# Patient Record
Sex: Female | Born: 1955 | Race: White | Hispanic: No | Marital: Married | Smoking: Never smoker
Health system: Southern US, Community
[De-identification: ages and names within clinical notes are randomized; demographics above are authoritative.]

## PROBLEM LIST (undated history)

## (undated) DIAGNOSIS — E119 Type 2 diabetes mellitus without complications: Secondary | ICD-10-CM

## (undated) DIAGNOSIS — M069 Rheumatoid arthritis, unspecified: Secondary | ICD-10-CM

---

## 2020-02-15 ENCOUNTER — Emergency Department (INDEPENDENT_AMBULATORY_CARE_PROVIDER_SITE_OTHER): Payer: Federal, State, Local not specified - PPO

## 2020-02-15 ENCOUNTER — Other Ambulatory Visit: Payer: Self-pay

## 2020-02-15 ENCOUNTER — Emergency Department (INDEPENDENT_AMBULATORY_CARE_PROVIDER_SITE_OTHER)
Admission: EM | Admit: 2020-02-15 | Discharge: 2020-02-15 | Disposition: A | Discharge status: Home / Self Care | Payer: Federal, State, Local not specified - PPO | Source: Home / Self Care

## 2020-02-15 DIAGNOSIS — M47816 Spondylosis without myelopathy or radiculopathy, lumbar region: Secondary | ICD-10-CM

## 2020-02-15 DIAGNOSIS — M5136 Other intervertebral disc degeneration, lumbar region: Secondary | ICD-10-CM | POA: Diagnosis not present

## 2020-02-15 DIAGNOSIS — G8929 Other chronic pain: Secondary | ICD-10-CM

## 2020-02-15 DIAGNOSIS — M25552 Pain in left hip: Secondary | ICD-10-CM

## 2020-02-15 DIAGNOSIS — M79662 Pain in left lower leg: Secondary | ICD-10-CM

## 2020-02-15 DIAGNOSIS — M545 Low back pain, unspecified: Secondary | ICD-10-CM | POA: Diagnosis not present

## 2020-02-15 DIAGNOSIS — W108XXA Fall (on) (from) other stairs and steps, initial encounter: Secondary | ICD-10-CM

## 2020-02-15 HISTORY — DX: Rheumatoid arthritis, unspecified: M06.9

## 2020-02-15 HISTORY — DX: Type 2 diabetes mellitus without complications: E11.9

## 2020-02-15 MED ORDER — METHOCARBAMOL 500 MG PO TABS: 500.0000 mg | ORAL_TABLET | Freq: Two times a day (BID) | ORAL | 0 refills | Status: AC

## 2020-02-15 NOTE — ED Triage Notes (Signed)
Pt c/o lower back pain since Tues when she fell backward onto some steps in her driveway. Also having pain in lower left leg/calf area from fall. Pain 7/10 Tylenol, heat and ice tried with little relief. Hx of lower back issues, has had some procedures done to "deaden" the nerves in lower back.

## 2020-02-15 NOTE — Discharge Instructions (Signed)
°  Your x-rays did not show any new findings. It is recommended you call your pain specialist tomorrow for recheck of symptoms.   Call 911 or have someone drive you to the hospital if symptoms significantly worsening including worsening pain, trouble walking, trouble controlling your stool or urine, or other new concerning symptoms develop.

## 2020-02-15 NOTE — ED Provider Notes (Signed)
[facility]    CSN: [id number] Arrival date & time: 02/15/20  1447      History   Chief Complaint Chief Complaint  Patient presents with  . Back Pain    HPI Teresa Rivera is a 64 y.o. female.   HPI  Teresa Rivera is a 64 y.o. female presenting to UC with c/o Left lower back, hip and leg pain after falling backward on cement steps in driveway 2 days ago. Denies hitting head or LOC. Pain is aching and sore, worse with certain movements.  She has taken Tylenol and used ice and heat with mild relief.  Hx of chronic low back pain.  Pt had bilateral facet/paravert joint radiofrequency at L4-L5, L5-S1 on 02/02/20. Pt states the procedure was minimally invasive, she was awake and notes the incisions were "pinpoint" in size. Pt reports having improvement of her pain but now pain is worse than before the surgery. Pt has not called her pain management specialist yet.  Denies numbness or weakness in legs. No change in bowel or bladder habits.   Past Medical History:  Diagnosis Date  . Diabetes mellitus without complication (HCC)   . Rheumatoid arthritis (HCC)     There are no problems to display for this patient.   History reviewed. No pertinent surgical history.  OB History   No obstetric history on file.      Home Medications    Prior to Admission medications   Medication Sig Start Date End Date Taking? Authorizing Provider  albuterol (VENTOLIN HFA) 108 (90 Base) MCG/ACT inhaler Inhale into the lungs. 11/16/19  Yes [provider]  atomoxetine (STRATTERA) 25 MG capsule Take by mouth. 12/05/19  Yes [provider]  atorvastatin (LIPITOR) 40 MG tablet Take by mouth. 11/30/17 03/12/20 Yes [provider]  azelastine (ASTELIN) 0.1 % nasal spray instill 2 sprays into each nostril twice a day as directed 09/06/13  Yes [provider]  buPROPion (WELLBUTRIN XL) 150 MG 24 hr tablet Take by mouth. 01/10/19  Yes [provider]   Cyanocobalamin 1000 MCG SUBL Place under the tongue. 02/18/17  Yes [provider]  escitalopram (LEXAPRO) 10 MG tablet Take by mouth. 06/15/16  Yes [provider]  fluticasone (FLOVENT HFA) 110 MCG/ACT inhaler Inhale into the lungs. 08/30/18 11/09/20 Yes [provider]  gabapentin (NEURONTIN) 300 MG capsule Take 1 capsule (300mg ) in the morning and 2 capsules (600mg ) at bedtime 03/08/19  Yes [provider]  glimepiride (AMARYL) 4 MG tablet Take by mouth. 03/02/19 03/12/20 Yes [provider]  hydroxychloroquine (PLAQUENIL) 200 MG tablet Take 1 tablet by mouth 2 (two) times daily. 09/12/13 01/13/21 Yes [provider]  levocetirizine (XYZAL) 5 MG tablet Take by mouth. 02/24/19 02/11/21 Yes [provider]  LORazepam (ATIVAN) 1 MG tablet 1 mg every 8 (eight) hours as needed. 11/17/11  Yes [provider]  losartan (COZAAR) 50 MG tablet Take by mouth. 03/08/19  Yes [provider]  temazepam (RESTORIL) 15 MG capsule Take 1-2 caps nightly PRN insomnia 02/14/20  Yes [provider]  methocarbamol (ROBAXIN) 500 MG tablet Take 1 tablet (500 mg total) by mouth 2 (two) times daily. 02/15/20   13/3/21, PA-C    Family History History reviewed. No pertinent family history.  Social History Social History   Tobacco Use  . Smoking status: Never Smoker  . Smokeless tobacco: Never Used  Vaping Use  . Vaping Use: Never used  Substance Use Topics  .  Alcohol use: Not Currently  . Drug use: Not on file     Allergies   Patient has no known allergies.   Review of Systems Review of Systems  Musculoskeletal: Positive for back pain and myalgias.  Neurological: Negative for dizziness, weakness, numbness and headaches.     Physical Exam Triage Vital Signs ED Triage Vitals  Enc Vitals Group     BP 02/15/20 1510 93/66     Pulse Rate 02/15/20 1510 80     Resp 02/15/20 1510 18     Temp 02/15/20 1510 98.6 F (37  C)     Temp Source 02/15/20 1510 Oral     SpO2 02/15/20 1510 97 %     Weight --      Height --      Head Circumference --      Peak Flow --      Pain Score 02/15/20 1501 7     Pain Loc --      Pain Edu? --      Excl. in GC? --    No data found.  Updated Vital Signs BP 93/66 (BP Location: Right Arm)   Pulse 80   Temp 98.6 F (37 C) (Oral)   Resp 18   SpO2 97%   Visual Acuity Right Eye Distance:   Left Eye Distance:   Bilateral Distance:    Right Eye Near:   Left Eye Near:    Bilateral Near:     Physical Exam Vitals and nursing note reviewed.  Constitutional:      General: She is not in acute distress.    Appearance: Normal appearance. She is well-developed. She is obese. She is not ill-appearing, toxic-appearing or diaphoretic.  HENT:     Head: Normocephalic and atraumatic.  Cardiovascular:     Rate and Rhythm: Normal rate.  Pulmonary:     Effort: Pulmonary effort is normal.  Musculoskeletal:        General: Tenderness present. Normal range of motion.     Cervical back: Normal range of motion.       Back:     Comments: Tenderness to Left lower back and hip. No deformity. Full ROM hip with increased pain with movement.  Mild tenderness to Left lateral lower leg. Calf is soft, non-tender.  Full ROM Left knee, non-tender.  Skin:    General: Skin is warm and dry.     Findings: No bruising or erythema.  Neurological:     Mental Status: She is alert and oriented to person, place, and time.  Psychiatric:        Behavior: Behavior normal.      UC Treatments / Results  Labs (all labs ordered are listed, but only abnormal results are displayed) Labs Reviewed - No data to display  EKG   Radiology DG Lumbar Spine Complete  Result Date: 02/15/2020 CLINICAL DATA:  Larey Seat.  Back pain. EXAM: LUMBAR SPINE - COMPLETE 4+ VIEW COMPARISON:  None. FINDINGS: Moderate degenerative lumbar spondylosis with multilevel disc disease and facet disease. The lumbar vertebral  bodies are normally aligned. No acute fracture is identified. The facets are normally aligned. No pars defects. The visualized bony pelvis is intact.  The SI joints appear normal. IMPRESSION: Degenerative lumbar spondylosis with multilevel disc disease and facet disease but no acute bony findings. Electronically Signed   By: Rudie Meyer M.D.   On: 02/15/2020 16:16   DG Hip Unilat W or Wo Pelvis 2-3 Views Left  Result Date: 02/15/2020 CLINICAL  DATA:  Fall.  Hip pain. EXAM: DG HIP (WITH OR WITHOUT PELVIS) 2-3V LEFT COMPARISON:  None. FINDINGS: There is no evidence of hip fracture or dislocation. Mild bilateral hip degenerative change. Partially imaged lower lumbar spine degenerative change. Scattered enthesopathy. IMPRESSION: No evidence of acute fracture or dislocation. Electronically Signed   By: Feliberto Harts MD   On: 02/15/2020 16:17    Procedures Procedures (including critical care time)  Medications Ordered in UC Medications - No data to display  Initial Impression / Assessment and Plan / UC Course  I have reviewed the triage vital signs and the nursing notes.  Pertinent labs & imaging results that were available during my care of the patient were reviewed by me and considered in my medical decision making (see chart for details).     Reviewed imaging with pt No red flag symptoms Encouraged to call her surgeon tomorrow to discuss the fall in case additional imaging is recommended Discussed symptoms that warrant emergent care in the ED. AVS given  Final Clinical Impressions(s) / UC Diagnoses   Final diagnoses:  Fall down stairs, initial encounter  Acute exacerbation of chronic low back pain  Left hip pain  Pain in left lower leg     Discharge Instructions      Your x-rays did not show any new findings. It is recommended you call your pain specialist tomorrow for recheck of symptoms.   Call 911 or have someone drive you to the hospital if symptoms significantly  worsening including worsening pain, trouble walking, trouble controlling your stool or urine, or other new concerning symptoms develop.      ED Prescriptions    Medication Sig Dispense Auth. Provider   methocarbamol (ROBAXIN) 500 MG tablet Take 1 tablet (500 mg total) by mouth 2 (two) times daily. 10 tablet Lurene Shadow, PA-C     PDMP not reviewed this encounter.   Lurene Shadow, PA-C 02/15/20 2005

## 2021-10-08 IMAGING — DX DG HIP (WITH OR WITHOUT PELVIS) 2-3V*L*
3 series · 3 of 3 positions shown · non-contrast
Comparison: None.

CLINICAL DATA: Fall.  Hip pain.

EXAM:
DG HIP (WITH OR WITHOUT PELVIS) 2-3V LEFT

[pelvis ap]
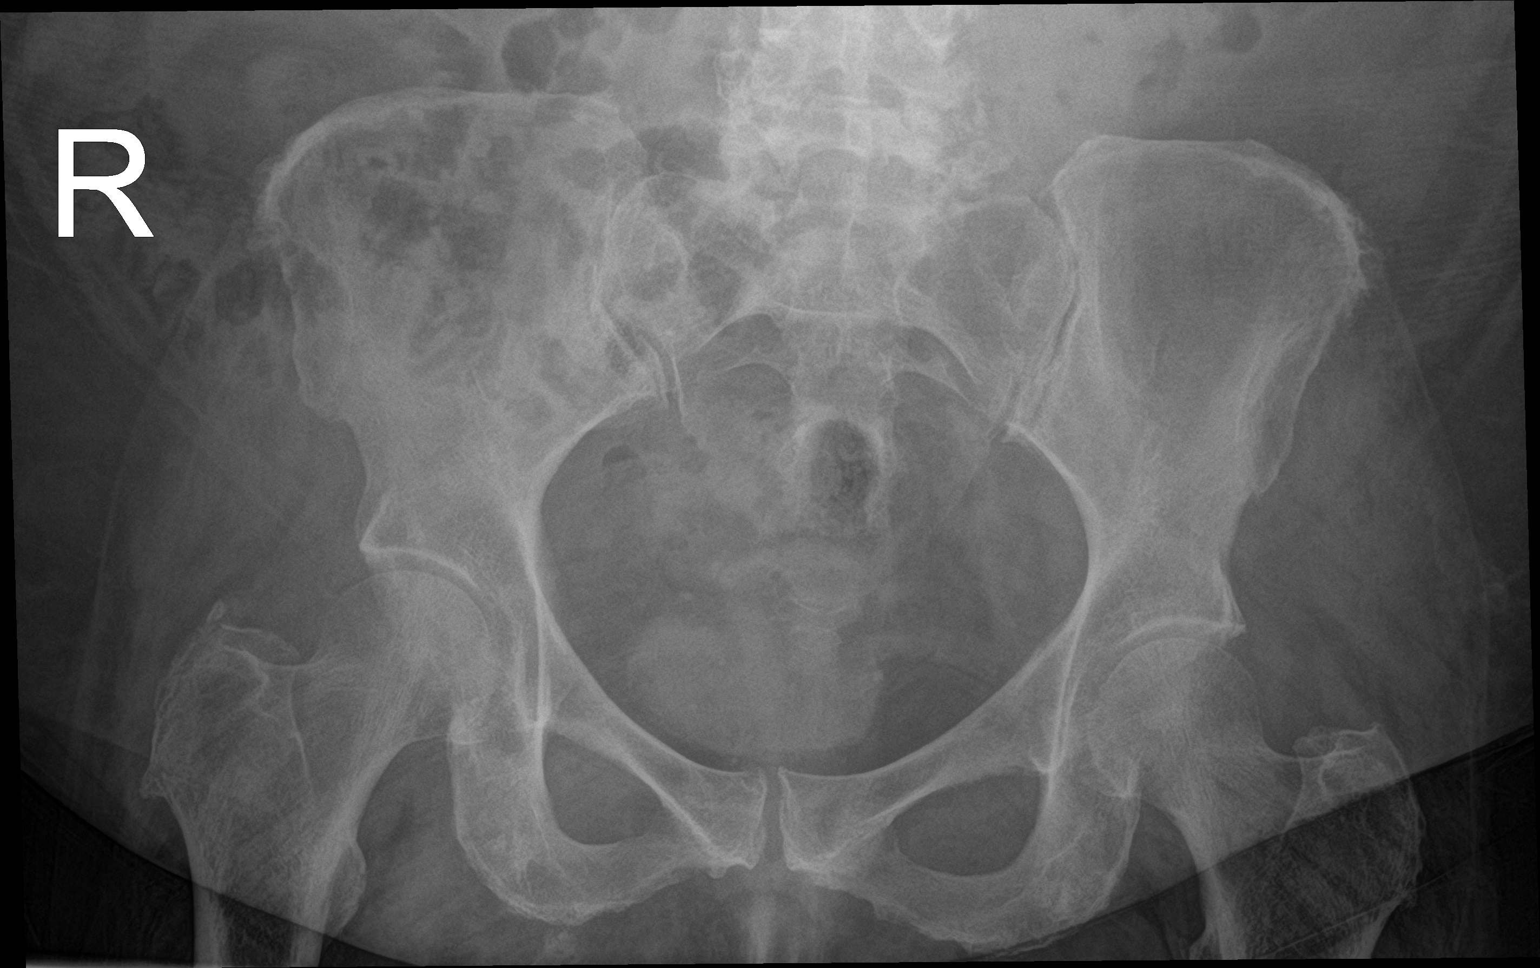

[hip ap]
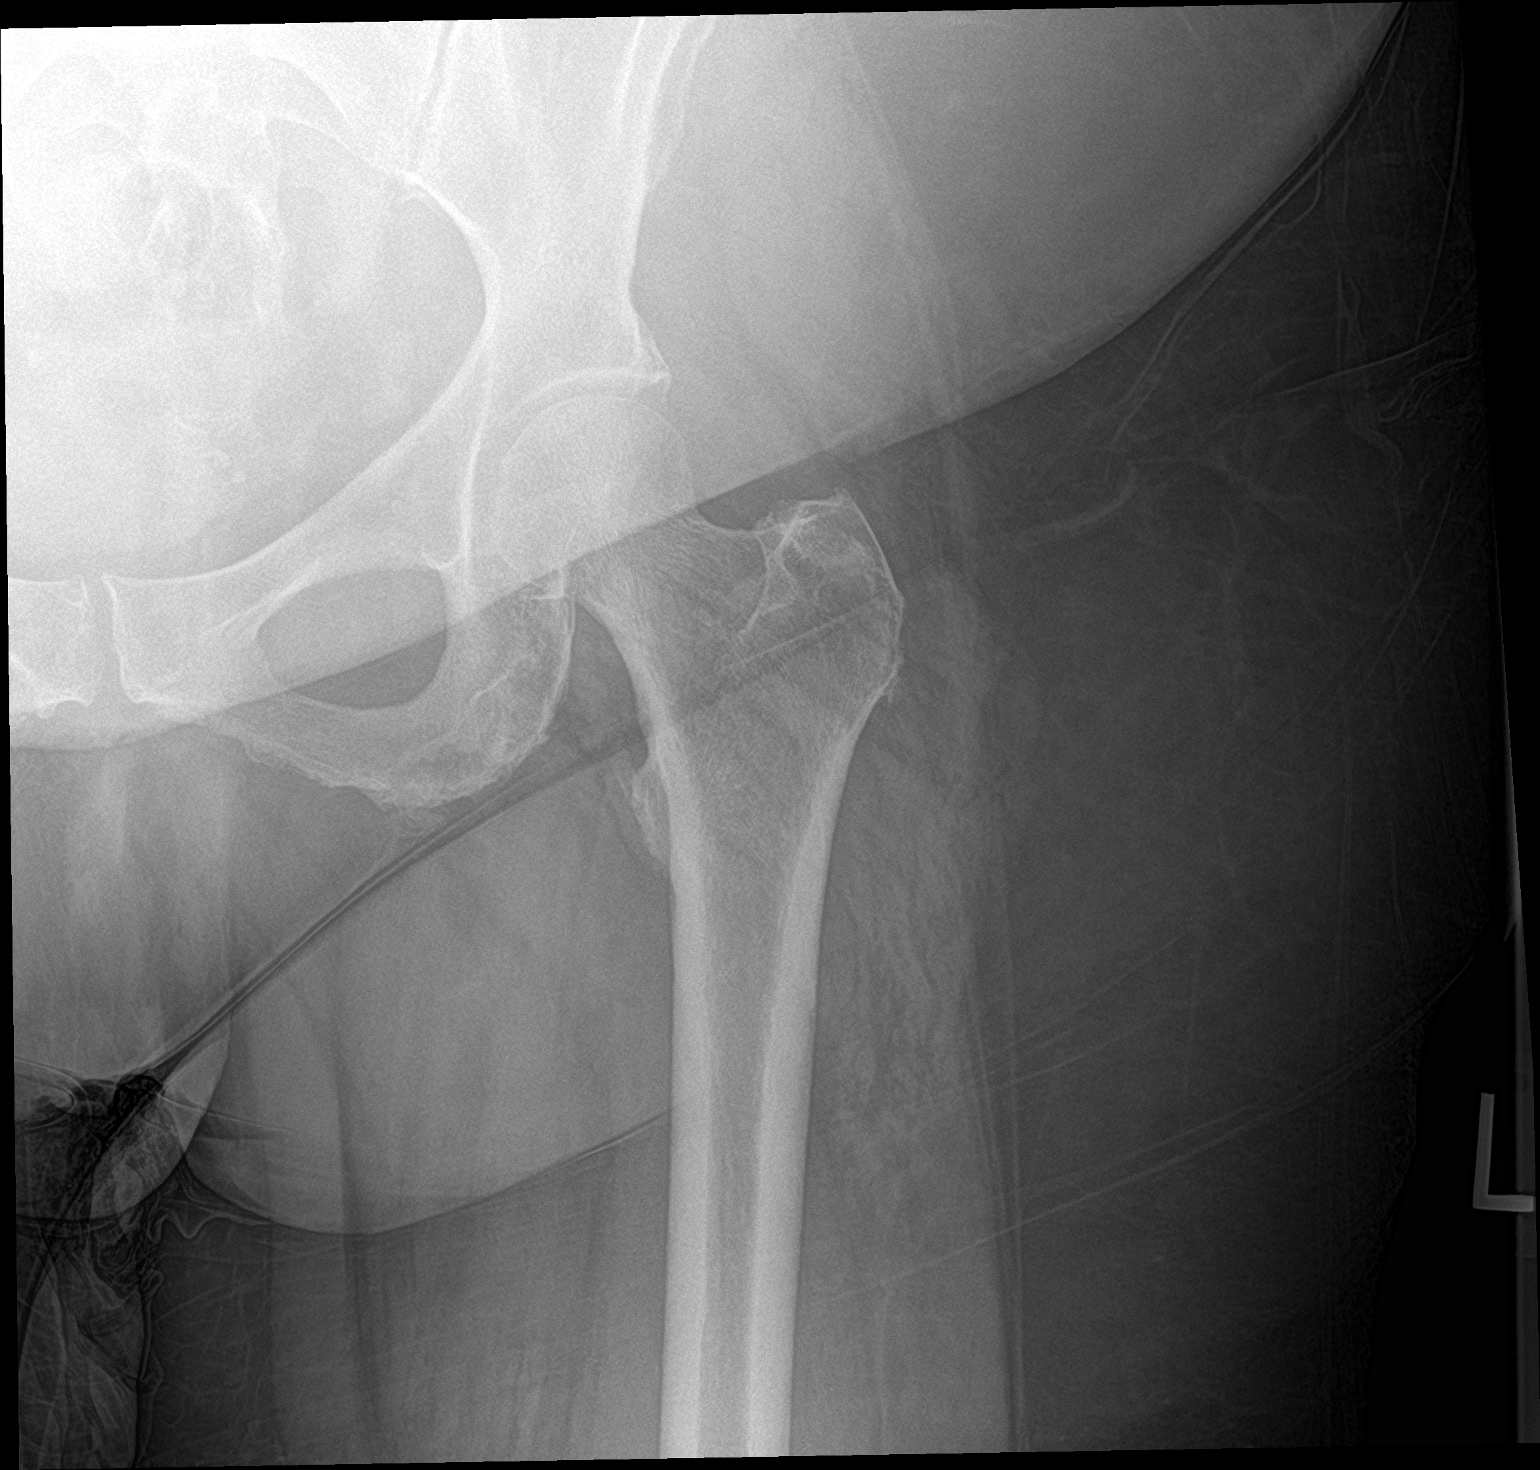

[hip lat]
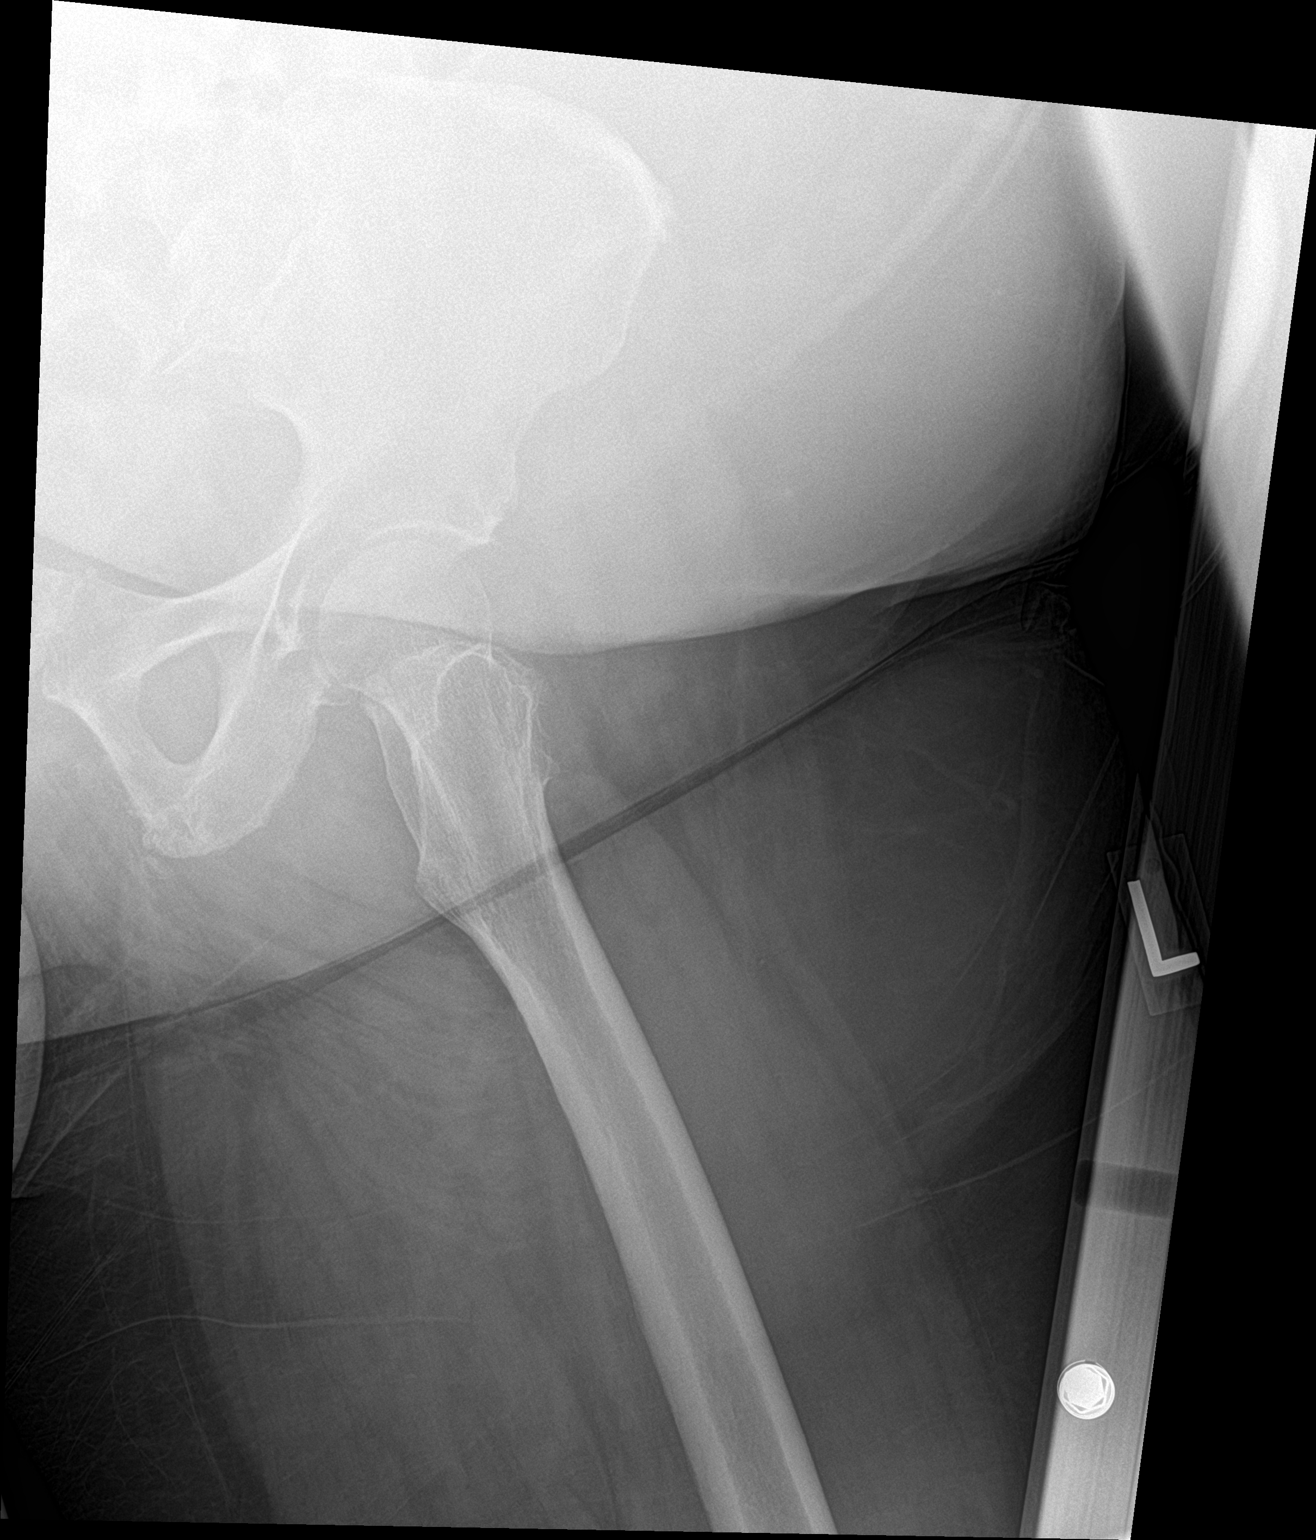

[3 of 3 positions shown; findings below may reference images not displayed]

FINDINGS: There is no evidence of hip fracture or dislocation. Mild bilateral
hip degenerative change. Partially imaged lower lumbar spine
degenerative change. Scattered enthesopathy.
IMPRESSION: No evidence of acute fracture or dislocation.
